# Patient Record
Sex: Female | Born: 1970 | Race: White | Hispanic: No | Marital: Married | Smoking: Current every day smoker
Health system: Southern US, Community
[De-identification: ages and names within clinical notes are randomized; demographics above are authoritative.]

## PROBLEM LIST (undated history)

## (undated) DIAGNOSIS — Q893 Situs inversus: Secondary | ICD-10-CM

## (undated) DIAGNOSIS — R42 Dizziness and giddiness: Secondary | ICD-10-CM

## (undated) DIAGNOSIS — G43909 Migraine, unspecified, not intractable, without status migrainosus: Secondary | ICD-10-CM

## (undated) DIAGNOSIS — J45909 Unspecified asthma, uncomplicated: Secondary | ICD-10-CM

---

## 2001-05-19 ENCOUNTER — Other Ambulatory Visit: Admission: RE | Admit: 2001-05-19 | Discharge: 2001-05-19 | Payer: Self-pay | Admitting: Obstetrics and Gynecology

## 2003-03-24 ENCOUNTER — Ambulatory Visit (HOSPITAL_COMMUNITY): Admission: AD | Admit: 2003-03-24 | Discharge: 2003-03-24 | Payer: Self-pay | Admitting: Obstetrics and Gynecology

## 2003-04-18 ENCOUNTER — Ambulatory Visit (HOSPITAL_COMMUNITY): Admission: AD | Admit: 2003-04-18 | Discharge: 2003-04-18 | Payer: Self-pay | Admitting: Obstetrics and Gynecology

## 2003-04-20 ENCOUNTER — Inpatient Hospital Stay (HOSPITAL_COMMUNITY): Admission: RE | Admit: 2003-04-20 | Discharge: 2003-04-22 | Payer: Self-pay | Admitting: Obstetrics and Gynecology

## 2006-10-27 ENCOUNTER — Ambulatory Visit (HOSPITAL_COMMUNITY): Admission: RE | Admit: 2006-10-27 | Discharge: 2006-10-27 | Payer: Self-pay | Admitting: Family Medicine

## 2006-11-27 ENCOUNTER — Ambulatory Visit (HOSPITAL_COMMUNITY): Payer: Self-pay | Admitting: Psychiatry

## 2007-06-24 ENCOUNTER — Ambulatory Visit (HOSPITAL_COMMUNITY): Admission: RE | Admit: 2007-06-24 | Discharge: 2007-06-24 | Payer: Self-pay | Admitting: Family Medicine

## 2007-12-04 ENCOUNTER — Ambulatory Visit (HOSPITAL_COMMUNITY): Admission: RE | Admit: 2007-12-04 | Discharge: 2007-12-04 | Payer: Self-pay | Admitting: Nurse Practitioner

## 2008-01-07 ENCOUNTER — Ambulatory Visit (HOSPITAL_COMMUNITY): Admission: RE | Admit: 2008-01-07 | Discharge: 2008-01-07 | Payer: Self-pay | Admitting: Nurse Practitioner

## 2010-06-03 ENCOUNTER — Emergency Department (HOSPITAL_COMMUNITY): Admission: EM | Admit: 2010-06-03 | Discharge: 2010-06-03 | Payer: Self-pay | Admitting: Emergency Medicine

## 2010-07-01 ENCOUNTER — Emergency Department (HOSPITAL_COMMUNITY): Admission: EM | Admit: 2010-07-01 | Discharge: 2010-07-01 | Payer: Self-pay | Admitting: Emergency Medicine

## 2011-05-17 NOTE — H&P (Signed)
   NAME:  April Mclaughlin, April Mclaughlin NO.:  [id number]   MEDICAL RECORD NO.:  [id number]                   PATIENT TYPE:  INP   LOCATION:  LDR1                                 FACILITY:  [facility]   PHYSICIAN:  Tilda Burrow, M.D.              DATE OF BIRTH:  1971/06/22   DATE OF ADMISSION:  04/20/2003  DATE OF DISCHARGE:                                HISTORY & PHYSICAL   REASON FOR ADMISSION:  Pregnancy at 40 weeks with onset of labor.  The  patient presented at approximately 5 a.m. in active labor.   PAST MEDICAL HISTORY:  Medical history is positive for depression.   PAST SURGICAL HISTORY:  Surgical history is negative.   ALLERGIES:  She is allergic to PENICILLIN.   MEDICATIONS:  She is on prenatal vitamins.   PRENATAL COURSE:  Prenatal course was essentially uneventful.   PRENATAL LABORATORIES:  Blood type is A positive, antibody screen is  negative, rubella is immune at 116, VDRL is nonreactive, UDS is positive for  THC, hepatitis B surface antigen negative, HIV is negative, Chlamydia and GC  are both negative.  She was prior GBS positive 28 weeks, hemoglobin 7.4,  hematocrit was 37.8, 1-hour glucose was 145, 3-hour glucose was normal.   PHYSICAL EXAMINATION:  Vital signs within normal limits, fetal heart rate is  stable, cervix is approximately 5 cm, and she is having regular  contractions.   PLAN:  We are going to admit and expect vaginal delivery and start  prophylaxis for prior positive GBS.     Zerita Boers, Reita Cliche, M.D.    DL/MEDQ  D:  91/47/8295  T:  04/20/2003  Job:  [id number]   cc:   [facility]

## 2011-05-17 NOTE — Op Note (Signed)
   NAME:  ATIYAH, BAUER NO.:  [id number]   MEDICAL RECORD NO.:  [id number]                   PATIENT TYPE:  INP   LOCATION:  LDR1                                 FACILITY:  [facility]   PHYSICIAN:  Tilda Burrow, M.D.              DATE OF BIRTH:  [birth year]   DATE OF PROCEDURE:  04/20/2003  DATE OF DISCHARGE:                                  PROCEDURE NOTE   DELIVERY SUMMARY:   ONSET OF LABOR:  April 20, 2003 at 1:30 a.m.   DATE OF DELIVERY:  April 20, 2003 at 8:37 a.m.   LENGTH OF FIRST STAGE LABOR:  Seven hours.   LENGTH OF SECOND STAGE LABOR:  Seven minutes.   LENGTH OF THIRD STAGE LABOR:  Three minutes.   DELIVERY NOTE:  The patient had a normal spontaneous vaginal delivery of a  viable female infant weighing 7 pounds 2 ounces.  Upon delivery of infant,  there was thorough suctioning, drying of infant; infant had good tone,  movement of all extremities and a strong cry and pinked up very well.  Cord  was clamped and cut and the infant placed on mother's abdomen in good  condition.  Upon inspection, perineum was noted to be intact.  Third stage  of labor was actively managed with 20 units of Pitocin in 1000 mL of D-5 LR  at a rapid rate.  Placenta delivered spontaneously via Schultze's mechanism,  with membranes intact upon inspection; three-vessel cord was noted upon  inspection.  Estimated blood loss was approximately 300 mL.  Mother and  infant were stabilized, tolerated delivery well and transferred out to the  postpartum unit in stable condition.      Zerita Boers, Reita Cliche, M.D.    DL/MEDQ  D:  62/70/3500  T:  04/20/2003  Job:  [id number]   cc:   [facility]

## 2015-06-16 ENCOUNTER — Emergency Department (HOSPITAL_COMMUNITY): Payer: BLUE CROSS/BLUE SHIELD

## 2015-06-16 ENCOUNTER — Emergency Department (HOSPITAL_COMMUNITY)
Admission: EM | Admit: 2015-06-16 | Discharge: 2015-06-16 | Disposition: A | Discharge status: Home / Self Care | Payer: BLUE CROSS/BLUE SHIELD | Attending: Emergency Medicine | Admitting: Emergency Medicine

## 2015-06-16 ENCOUNTER — Encounter (HOSPITAL_COMMUNITY): Payer: Self-pay | Admitting: Emergency Medicine

## 2015-06-16 DIAGNOSIS — R42 Dizziness and giddiness: Secondary | ICD-10-CM | POA: Diagnosis present

## 2015-06-16 DIAGNOSIS — Z880 Allergy status to penicillin: Secondary | ICD-10-CM | POA: Diagnosis not present

## 2015-06-16 DIAGNOSIS — H81399 Other peripheral vertigo, unspecified ear: Secondary | ICD-10-CM | POA: Insufficient documentation

## 2015-06-16 DIAGNOSIS — J45909 Unspecified asthma, uncomplicated: Secondary | ICD-10-CM | POA: Insufficient documentation

## 2015-06-16 DIAGNOSIS — Z3202 Encounter for pregnancy test, result negative: Secondary | ICD-10-CM | POA: Insufficient documentation

## 2015-06-16 DIAGNOSIS — Q893 Situs inversus: Secondary | ICD-10-CM | POA: Insufficient documentation

## 2015-06-16 DIAGNOSIS — H8110 Benign paroxysmal vertigo, unspecified ear: Secondary | ICD-10-CM

## 2015-06-16 DIAGNOSIS — Z720 Tobacco use: Secondary | ICD-10-CM | POA: Diagnosis not present

## 2015-06-16 DIAGNOSIS — Z8679 Personal history of other diseases of the circulatory system: Secondary | ICD-10-CM | POA: Insufficient documentation

## 2015-06-16 HISTORY — DX: Unspecified asthma, uncomplicated: J45.909

## 2015-06-16 HISTORY — DX: Situs inversus: Q89.3

## 2015-06-16 HISTORY — DX: Migraine, unspecified, not intractable, without status migrainosus: G43.909

## 2015-06-16 LAB — CBC WITH DIFFERENTIAL/PLATELET
BASOS ABS: 0 10*3/uL (ref 0.0–0.1)
Basophils Relative: 0 % (ref 0–1)
Eosinophils Absolute: 0.2 10*3/uL (ref 0.0–0.7)
Eosinophils Relative: 2 % (ref 0–5)
HCT: 46.7 % — ABNORMAL HIGH (ref 36.0–46.0)
Hemoglobin: 15 g/dL (ref 12.0–15.0)
LYMPHS ABS: 2.2 10*3/uL (ref 0.7–4.0)
Lymphocytes Relative: 24 % (ref 12–46)
MCH: 29.7 pg (ref 26.0–34.0)
MCHC: 32.1 g/dL (ref 30.0–36.0)
MCV: 92.5 fL (ref 78.0–100.0)
MONO ABS: 0.5 10*3/uL (ref 0.1–1.0)
MONOS PCT: 5 % (ref 3–12)
NEUTROS ABS: 6.2 10*3/uL (ref 1.7–7.7)
Neutrophils Relative %: 69 % (ref 43–77)
Platelets: 252 10*3/uL (ref 150–400)
RBC: 5.05 MIL/uL (ref 3.87–5.11)
RDW: 14 % (ref 11.5–15.5)
WBC: 9.1 10*3/uL (ref 4.0–10.5)

## 2015-06-16 LAB — BASIC METABOLIC PANEL
Anion gap: 5 (ref 5–15)
BUN: 14 mg/dL (ref 6–20)
CALCIUM: 8.8 mg/dL — AB (ref 8.9–10.3)
CHLORIDE: 108 mmol/L (ref 101–111)
CO2: 26 mmol/L (ref 22–32)
CREATININE: 0.96 mg/dL (ref 0.44–1.00)
Glucose, Bld: 107 mg/dL — ABNORMAL HIGH (ref 65–99)
Potassium: 4.1 mmol/L (ref 3.5–5.1)
Sodium: 139 mmol/L (ref 135–145)

## 2015-06-16 LAB — CBG MONITORING, ED: Glucose-Capillary: 108 mg/dL — ABNORMAL HIGH (ref 65–99)

## 2015-06-16 LAB — POC URINE PREG, ED: Preg Test, Ur: NEGATIVE

## 2015-06-16 MED ORDER — LORAZEPAM 1 MG PO TABS: 1.0000 mg | ORAL_TABLET | Freq: Three times a day (TID) | ORAL | Status: DC | PRN

## 2015-06-16 MED ORDER — LORAZEPAM 1 MG PO TABS
1.0000 mg | ORAL_TABLET | Freq: Once | ORAL | Status: AC
  Administered 2015-06-16: 1 mg via ORAL
  Filled 2015-06-16: qty 1

## 2015-06-16 MED ORDER — MECLIZINE HCL 25 MG PO TABS: 25.0000 mg | ORAL_TABLET | Freq: Three times a day (TID) | ORAL | Status: DC | PRN

## 2015-06-16 MED ORDER — MECLIZINE HCL 12.5 MG PO TABS
25.0000 mg | ORAL_TABLET | Freq: Once | ORAL | Status: AC
  Administered 2015-06-16: 25 mg via ORAL
  Filled 2015-06-16: qty 2

## 2015-06-16 NOTE — Discharge Instructions (Signed)
Benign Positional Vertigo Vertigo means you feel like you or your surroundings are moving when they are not. Benign positional vertigo is the most common form of vertigo. Benign means that the cause of your condition is not serious. Benign positional vertigo is more common in older adults. CAUSES  Benign positional vertigo is the result of an upset in the labyrinth system. This is an area in the middle ear that helps control your balance. This may be caused by a viral infection, head injury, or repetitive motion. However, often no specific cause is found. SYMPTOMS  Symptoms of benign positional vertigo occur when you move your head or eyes in different directions. Some of the symptoms may include:  Loss of balance and falls.  Vomiting.  Blurred vision.  Dizziness.  Nausea.  Involuntary eye movements (nystagmus). DIAGNOSIS  Benign positional vertigo is usually diagnosed by physical exam. If the specific cause of your benign positional vertigo is unknown, your caregiver may perform imaging tests, such as magnetic resonance imaging (MRI) or computed tomography (CT). TREATMENT  Your caregiver may recommend movements or procedures to correct the benign positional vertigo. Medicines such as meclizine, benzodiazepines, and medicines for nausea may be used to treat your symptoms. In rare cases, if your symptoms are caused by certain conditions that affect the inner ear, you may need surgery. HOME CARE INSTRUCTIONS   Follow your caregiver's instructions.  Move slowly. Do not make sudden body or head movements.  Avoid driving.  Avoid operating heavy machinery.  Avoid performing any tasks that would be dangerous to you or others during a vertigo episode.  Drink enough fluids to keep your urine clear or pale yellow. SEEK IMMEDIATE MEDICAL CARE IF:   You develop problems with walking, weakness, numbness, or using your arms, hands, or legs.  You have difficulty speaking.  You develop  severe headaches.  Your nausea or vomiting continues or gets worse.  You develop visual changes.  Your family or friends notice any behavioral changes.  Your condition gets worse.  You have a fever.  You develop a stiff neck or sensitivity to light. MAKE SURE YOU:   Understand these instructions.  Will watch your condition.  Will get help right away if you are not doing well or get worse. Document Released: 09/23/2006 Document Revised: 03/09/2012 Document Reviewed: 09/05/2011 [employer] Patient Information 2015 [employer]. This information is not intended to replace advice given to you by your health care provider. Make sure you discuss any questions you have with your health care provider.    

## 2015-06-16 NOTE — ED Notes (Signed)
Patient reports dizziness that started yesterday morning. Per patient feels like room is spinning intermittently but has a "wobbly" feeling constantly. Denies any headache, slurred speech, facial drooping or weakness. Patient does report occasional difficulty pronouncing words. [facility]

## 2015-06-18 NOTE — ED Provider Notes (Signed)
CSN: [id number]     Arrival date & time 06/16/15  1716 History   First MD Initiated Contact with Patient 06/16/15 1755     Chief Complaint  Patient presents with  . Dizziness     (Consider location/radiation/quality/duration/timing/severity/associated sxs/prior Treatment) Patient is a 44 y.o. female presenting with dizziness. The history is provided by the patient and the spouse.  Dizziness Quality:  Room spinning Severity:  Moderate Onset quality:  Unable to specify (woke with yesterday morning) Timing:  Intermittent (triggered by head movement and positional changes) Progression:  Unchanged Chronicity:  New Context: not with ear pain and not with loss of consciousness   Relieved by:  Being still Worsened by:  Movement Ineffective treatments:  None tried Associated symptoms: no chest pain, no headaches, no hearing loss, no nausea, no palpitations, no shortness of breath, no tinnitus, no vision changes and no weakness   Associated symptoms comment:  Denies focal weakness, no nausea, no fevers, no recent illnesses.  She has noticed several episodes of fleeting difficulty enunciating longer words today.  She states this is not unusual when anxious.  Risk factors: no anemia, no heart disease, no hx of vertigo, no multiple medications and no new medications     Past Medical History  Diagnosis Date  . Situs inversus   . Asthma   . Migraines    History reviewed. No pertinent past surgical history. Family History  Problem Relation Age of Onset  . Non-Hodgkin's lymphoma Father   . Emphysema Mother   . COPD Father   . COPD Mother    History  Substance Use Topics  . Smoking status: Current Every Day Smoker -- 1.50 packs/day for 20 years    Types: Cigarettes  . Smokeless tobacco: Never Used  . Alcohol Use: No   OB History    Gravida Para Term Preterm AB TAB SAB Ectopic Multiple Living   3 3 3       3      Review of Systems  Constitutional: Negative for fever.  HENT:  Negative for congestion, hearing loss, sore throat and tinnitus.   Eyes: Negative.   Respiratory: Negative for chest tightness and shortness of breath.   Cardiovascular: Negative for chest pain and palpitations.  Gastrointestinal: Negative for nausea and abdominal pain.  Genitourinary: Negative.   Musculoskeletal: Negative for joint swelling, arthralgias and neck pain.  Skin: Negative.  Negative for rash and wound.  Neurological: Positive for dizziness. Negative for facial asymmetry, weakness, light-headedness, numbness and headaches.  Psychiatric/Behavioral: Negative.       Allergies  Bee venom; Penicillins; Shellfish allergy; Eggs or egg-derived products; Meloxicam; and Tree extract  Home Medications   Prior to Admission medications   Medication Sig Start Date End Date Taking? Authorizing Provider  LORazepam (ATIVAN) 1 MG tablet Take 1 tablet (1 mg total) by mouth 3 (three) times daily as needed (dizziness). 06/16/15   Burgess Amor, PA-C  meclizine (ANTIVERT) 25 MG tablet Take 1 tablet (25 mg total) by mouth 3 (three) times daily as needed for dizziness. 06/16/15   Burgess Amor, PA-C   BP 152/87 mmHg  Pulse 78  Temp(Src) 98.7 F (37.1 C) (Oral)  Resp 18  Ht 5\' 2"  (1.575 m)  Wt 230 lb (104.327 kg)  BMI 42.06 kg/m2  SpO2 96%  LMP 06/16/2015 Physical Exam  Constitutional: She is oriented to person, place, and time. She appears well-developed and well-nourished.  HENT:  Head: Normocephalic and atraumatic.  Right Ear: External ear normal.  Left  Ear: External ear normal.  Eyes: Conjunctivae are normal.  Neck: Normal range of motion.  Cardiovascular: Normal rate, regular rhythm, normal heart sounds and intact distal pulses.   Pulmonary/Chest: Effort normal and breath sounds normal. She has no wheezes.  Musculoskeletal: Normal range of motion.  Neurological: She is alert and oriented to person, place, and time. She has normal strength. No cranial nerve deficit or sensory deficit.  Coordination and gait normal. GCS eye subscore is 4. GCS verbal subscore is 5. GCS motor subscore is 6.  Reflex Scores:      Bicep reflexes are 2+ on the right side and 2+ on the left side.      Patellar reflexes are 2+ on the right side and 2+ on the left side. Normal rapid alternating movements, no pronator drift, normal heel shin, down going toes, equal grip strength.   Pt had one episode of stumbling over a single word during the initial interview, repeated the word without difficulty.  Normal speech pattern.  Skin: Skin is warm and dry.  Psychiatric: She has a normal mood and affect.  Nursing note and vitals reviewed.   ED Course  Procedures (including critical care time) Labs Review Labs Reviewed  CBC WITH DIFFERENTIAL/PLATELET - Abnormal; Notable for the following:    HCT 46.7 (*)    All other components within normal limits  BASIC METABOLIC PANEL - Abnormal; Notable for the following:    Glucose, Bld 107 (*)    Calcium 8.8 (*)    All other components within normal limits  CBG MONITORING, ED - Abnormal; Notable for the following:    Glucose-Capillary 108 (*)    All other components within normal limits  POC URINE PREG, ED    Imaging Review Ct Head Wo Contrast  06/16/2015   CLINICAL DATA:  Dizziness  EXAM: CT HEAD WITHOUT CONTRAST  TECHNIQUE: Contiguous axial images were obtained from the base of the skull through the vertex without intravenous contrast.  COMPARISON:  None.  FINDINGS: Mild motion artifact obscures detail. No acute hemorrhage, infarct, or mass lesion is identified. No skull fracture. Orbits and paranasal sinuses are unremarkable.  IMPRESSION: Normal head CT.   Electronically Signed   By: Christiana Pellant M.D.   On: 06/16/2015 19:11     EKG Interpretation   Date/Time:  Friday June 16 2015 17:38:44 EDT Ventricular Rate:  71 PR Interval:  150 QRS Duration: 82 QT Interval:  390 QTC Calculation: 423 R Axis:   65 Text Interpretation:  Normal sinus rhythm  Cannot rule out Anterior infarct  , age undetermined Abnormal ECG ED PHYSICIAN INTERPRETATION AVAILABLE IN  [facility] Confirmed by TEST, Record ([id number]) on 06/17/2015 10:03:21 AM      MDM   Final diagnoses:  Vertigo, benign positional, unspecified laterality    Exam and history most c/w peripheral vertigo.  She was given meclizine and ativan here with near resolution of sx.  She was prescribed these medicines for home use. Advised she needs to return here for any worsened sx, otw f/u if sx persist beyond the next week.  Pt with no current pcp, she was referred to Dr. Gerilyn Pilgrim for outpt f/u if needed. Pt and husband understand and agree with plan. Stressed worsening sx should have prompt recheck.  The patient appears reasonably screened and/or stabilized for discharge and I doubt any other medical condition or other [facility] requiring further screening, evaluation, or treatment in the ED at this time prior to discharge.  Pt was discussed with  Dr. Rubin Payor prior to dc home.    Burgess Amor, PA-C 06/18/15 1327  Benjiman Core, MD 06/19/15 [phone]

## 2017-01-27 ENCOUNTER — Emergency Department (HOSPITAL_COMMUNITY)
Admission: EM | Admit: 2017-01-27 | Discharge: 2017-01-27 | Disposition: A | Discharge status: Home / Self Care | Payer: BLUE CROSS/BLUE SHIELD | Attending: Emergency Medicine | Admitting: Emergency Medicine

## 2017-01-27 ENCOUNTER — Encounter (HOSPITAL_COMMUNITY): Payer: Self-pay | Admitting: *Deleted

## 2017-01-27 ENCOUNTER — Emergency Department (HOSPITAL_COMMUNITY): Payer: BLUE CROSS/BLUE SHIELD

## 2017-01-27 DIAGNOSIS — R51 Headache: Secondary | ICD-10-CM | POA: Diagnosis not present

## 2017-01-27 DIAGNOSIS — R5383 Other fatigue: Secondary | ICD-10-CM | POA: Diagnosis not present

## 2017-01-27 DIAGNOSIS — R42 Dizziness and giddiness: Secondary | ICD-10-CM | POA: Insufficient documentation

## 2017-01-27 DIAGNOSIS — F17210 Nicotine dependence, cigarettes, uncomplicated: Secondary | ICD-10-CM | POA: Insufficient documentation

## 2017-01-27 DIAGNOSIS — R531 Weakness: Secondary | ICD-10-CM | POA: Insufficient documentation

## 2017-01-27 DIAGNOSIS — R110 Nausea: Secondary | ICD-10-CM | POA: Diagnosis not present

## 2017-01-27 DIAGNOSIS — J45909 Unspecified asthma, uncomplicated: Secondary | ICD-10-CM | POA: Diagnosis not present

## 2017-01-27 DIAGNOSIS — R519 Headache, unspecified: Secondary | ICD-10-CM

## 2017-01-27 HISTORY — DX: Dizziness and giddiness: R42

## 2017-01-27 LAB — BASIC METABOLIC PANEL
Anion gap: 8 (ref 5–15)
BUN: 9 mg/dL (ref 6–20)
CHLORIDE: 107 mmol/L (ref 101–111)
CO2: 23 mmol/L (ref 22–32)
CREATININE: 0.86 mg/dL (ref 0.44–1.00)
Calcium: 9 mg/dL (ref 8.9–10.3)
Glucose, Bld: 96 mg/dL (ref 65–99)
POTASSIUM: 3.5 mmol/L (ref 3.5–5.1)
SODIUM: 138 mmol/L (ref 135–145)

## 2017-01-27 LAB — CBC WITH DIFFERENTIAL/PLATELET
BASOS ABS: 0 10*3/uL (ref 0.0–0.1)
BASOS PCT: 0 %
EOS ABS: 0.1 10*3/uL (ref 0.0–0.7)
Eosinophils Relative: 1 %
HCT: 43.4 % (ref 36.0–46.0)
HEMOGLOBIN: 14.1 g/dL (ref 12.0–15.0)
Lymphocytes Relative: 25 %
Lymphs Abs: 2.2 10*3/uL (ref 0.7–4.0)
MCH: 29 pg (ref 26.0–34.0)
MCHC: 32.5 g/dL (ref 30.0–36.0)
MCV: 89.1 fL (ref 78.0–100.0)
Monocytes Absolute: 0.5 10*3/uL (ref 0.1–1.0)
Monocytes Relative: 6 %
NEUTROS PCT: 68 %
Neutro Abs: 5.9 10*3/uL (ref 1.7–7.7)
PLATELETS: 271 10*3/uL (ref 150–400)
RBC: 4.87 MIL/uL (ref 3.87–5.11)
RDW: 14.2 % (ref 11.5–15.5)
WBC: 8.7 10*3/uL (ref 4.0–10.5)

## 2017-01-27 LAB — URINALYSIS, ROUTINE W REFLEX MICROSCOPIC
BACTERIA UA: NONE SEEN
Bilirubin Urine: NEGATIVE
Glucose, UA: NEGATIVE mg/dL
KETONES UR: NEGATIVE mg/dL
LEUKOCYTES UA: NEGATIVE
NITRITE: NEGATIVE
PH: 6 (ref 5.0–8.0)
PROTEIN: NEGATIVE mg/dL
Specific Gravity, Urine: 1.002 — ABNORMAL LOW (ref 1.005–1.030)

## 2017-01-27 LAB — TROPONIN I: Troponin I: <0.03 ng/mL (ref <0.03)

## 2017-01-27 LAB — PREGNANCY, URINE: PREG TEST UR: NEGATIVE

## 2017-01-27 LAB — CBG MONITORING, ED: Glucose-Capillary: 134 mg/dL — ABNORMAL HIGH (ref 65–99)

## 2017-01-27 MED ORDER — DEXAMETHASONE SODIUM PHOSPHATE 4 MG/ML IJ SOLN
10.0000 mg | Freq: Once | INTRAMUSCULAR | Status: AC
  Administered 2017-01-27: 10 mg via INTRAVENOUS
  Filled 2017-01-27: qty 3

## 2017-01-27 MED ORDER — IOPAMIDOL (ISOVUE-370) INJECTION 76%
100.0000 mL | Freq: Once | INTRAVENOUS | Status: AC | PRN
  Administered 2017-01-27: 100 mL via INTRAVENOUS

## 2017-01-27 MED ORDER — MECLIZINE HCL 12.5 MG PO TABS: 12.5000 mg | ORAL_TABLET | Freq: Three times a day (TID) | ORAL | 0 refills | Status: DC | PRN

## 2017-01-27 MED ORDER — SODIUM CHLORIDE 0.9 % IV BOLUS (SEPSIS)
1000.0000 mL | Freq: Once | INTRAVENOUS | Status: AC
  Administered 2017-01-27: 1000 mL via INTRAVENOUS

## 2017-01-27 MED ORDER — DIPHENHYDRAMINE HCL 50 MG/ML IJ SOLN
25.0000 mg | Freq: Once | INTRAMUSCULAR | Status: AC
  Administered 2017-01-27: 25 mg via INTRAVENOUS
  Filled 2017-01-27: qty 1

## 2017-01-27 MED ORDER — NAPROXEN 500 MG PO TABS: 500.0000 mg | ORAL_TABLET | Freq: Two times a day (BID) | ORAL | 0 refills | Status: DC

## 2017-01-27 MED ORDER — METOCLOPRAMIDE HCL 5 MG/ML IJ SOLN
10.0000 mg | Freq: Once | INTRAMUSCULAR | Status: AC
  Administered 2017-01-27: 10 mg via INTRAVENOUS
  Filled 2017-01-27: qty 2

## 2017-01-27 MED ORDER — FENTANYL CITRATE (PF) 100 MCG/2ML IJ SOLN
50.0000 ug | Freq: Once | INTRAMUSCULAR | Status: AC
  Administered 2017-01-27: 50 ug via INTRAVENOUS
  Filled 2017-01-27: qty 2

## 2017-01-27 NOTE — Discharge Instructions (Signed)
Follow-up with your doctor.  Return to the ED if you develop new or worsening symptoms. °

## 2017-01-27 NOTE — ED Provider Notes (Signed)
[facility]Y DEPT Provider Note   CSN: [id number] Arrival date & time: 01/27/17  0102     History   Chief Complaint Chief Complaint  Patient presents with  . Headache    HPI April Mclaughlin is a 46 y.o. female.  Patient with multiple complaints. She reports a frontal headache onset 2 days ago associated with photosensitivity and nausea. This is also associated with dizziness when she stands and feels like a "drunk feeling" but not a vertigo feeling. She has a history of migraines as well as vertigo. She states this headache is dissimilar to her migraine and is not as severe. She standing at work when the headache happened denies the worst headache of her life and denies thunderclap onset. She says she had a sit down she felt dizzy and nauseated and the headache started after that. She went home and went to bed. The headache has persisted since but is improved. She complains of pain to her right arm from the shoulder down to her elbow. This is been hurting her for the past one week and denies any trauma or injury. Denies any weakness, numbness or tingling. Does have trouble lifting with his arm at work which she normally does not. She did not take any medicine at home for the headache. Denies any blurry vision or double vision. Denies any visual loss. Denies any chest pain or shortness of breath.   The history is provided by the patient.  Headache   Associated symptoms include nausea. Pertinent negatives include no fever, no shortness of breath and no vomiting.    Past Medical History:  Diagnosis Date  . Asthma   . Migraines   . Situs inversus   . Vertigo     There are no active problems to display for this patient.   History reviewed. No pertinent surgical history.  OB History    Gravida Para Term Preterm AB Living   3 3 3     3    SAB TAB Ectopic Multiple Live Births                   Home Medications    Prior to Admission medications   Medication Sig Start  Date End Date Taking? Authorizing Provider  LORazepam (ATIVAN) 1 MG tablet Take 1 tablet (1 mg total) by mouth 3 (three) times daily as needed (dizziness). 06/16/15   Burgess Amor, PA-C  meclizine (ANTIVERT) 25 MG tablet Take 1 tablet (25 mg total) by mouth 3 (three) times daily as needed for dizziness. 06/16/15   Burgess Amor, PA-C    Family History Family History  Problem Relation Age of Onset  . Non-Hodgkin's lymphoma Father   . COPD Father   . Emphysema Mother   . COPD Mother     Social History Social History  Substance Use Topics  . Smoking status: Current Every Day Smoker    Packs/day: 1.00    Years: 20.00    Types: Cigarettes  . Smokeless tobacco: Never Used  . Alcohol use No     Allergies   Bee venom; Penicillins; Shellfish allergy; Eggs or egg-derived products; Meloxicam; and Tree extract   Review of Systems Review of Systems  Constitutional: Positive for fatigue. Negative for activity change, appetite change and fever.  HENT: Negative for congestion and rhinorrhea.   Respiratory: Negative for cough, chest tightness and shortness of breath.   Cardiovascular: Negative for chest pain and leg swelling.  Gastrointestinal: Positive for nausea. Negative for abdominal pain and  vomiting.  Genitourinary: Negative for dysuria and hematuria.  Musculoskeletal: Negative for arthralgias, back pain and neck pain.  Skin: Negative for rash.  Neurological: Positive for dizziness, weakness, light-headedness and headaches. Negative for seizures and numbness.  A complete 10 system review of systems was obtained and all systems are negative except as noted in the HPI and PMH.     Physical Exam Updated Vital Signs BP 148/75 (BP Location: Left Arm)   Pulse 65   Temp 97.6 F (36.4 C) (Oral)   Resp 20   Ht 5\' 3"  (1.6 m)   Wt 240 lb (108.9 kg)   LMP 01/15/2017   SpO2 94%   BMI 42.51 kg/m   Physical Exam  Constitutional: She is oriented to person, place, and time. She appears  well-developed and well-nourished. No distress.  HENT:  Head: Normocephalic and atraumatic.  Mouth/Throat: Oropharynx is clear and moist. No oropharyngeal exudate.  Eyes: Conjunctivae and EOM are normal. Pupils are equal, round, and reactive to light.  Neck: Normal range of motion. Neck supple.  No meningismus.  Cardiovascular: Normal rate, regular rhythm, normal heart sounds and intact distal pulses.   No murmur heard. Pulmonary/Chest: Effort normal and breath sounds normal. No respiratory distress. She exhibits no tenderness.  Abdominal: Soft. There is no tenderness. There is no rebound and no guarding.  Musculoskeletal: Normal range of motion. She exhibits tenderness. She exhibits no edema.  TTP R trapezius and rhomboid  Neurological: She is alert and oriented to person, place, and time. No cranial nerve deficit. She exhibits normal muscle tone. Coordination normal.  No ataxia on finger to nose bilaterally. No pronator drift. 5/5 strength throughout. CN 2-12 intact.Equal grip strength. Sensation intact.  Visual fields full to confrontation. No nystagmus, negative romberg, normal gait.  Skin: Skin is warm.  Psychiatric: She has a normal mood and affect. Her behavior is normal.  Nursing note and vitals reviewed.    ED Treatments / Results  Labs (all labs ordered are listed, but only abnormal results are displayed) Labs Reviewed  CBG MONITORING, ED - Abnormal; Notable for the following:       Result Value   Glucose-Capillary 134 (*)    All other components within normal limits  URINALYSIS, ROUTINE W REFLEX MICROSCOPIC  PREGNANCY, URINE  CBC WITH DIFFERENTIAL/PLATELET  BASIC METABOLIC PANEL  TROPONIN I    EKG  EKG Interpretation  Date/Time:  Monday January 27 2017 02:00:02 EST Ventricular Rate:  58 PR Interval:    QRS Duration: 90 QT Interval:  452 QTC Calculation: 444 R Axis:   92 Text Interpretation:  Sinus rhythm Borderline right axis deviation Nonspecific T abnrm,  anterolateral leads Nonspecific T wave abnormality Confirmed by Manus Gunning  MD, Darthula Desa [phone]) on 01/27/2017 2:15:30 AM       Radiology Ct Angio Head W Or Wo Contrast  Result Date: 01/27/2017 CLINICAL DATA:  Initial evaluation for acute headache, dizziness. EXAM: CT ANGIOGRAPHY HEAD AND NECK TECHNIQUE: Multidetector CT imaging of the head and neck was performed using the standard protocol during bolus administration of intravenous contrast. Multiplanar CT image reconstructions and MIPs were obtained to evaluate the vascular anatomy. Carotid stenosis measurements (when applicable) are obtained utilizing NASCET criteria, using the distal internal carotid diameter as the denominator. CONTRAST:  75 cc of Isovue 370. COMPARISON:  Prior CT from 06/16/2015. FINDINGS: CT HEAD FINDINGS Brain: Cerebral volume within normal limits for patient age. No evidence for acute intracranial hemorrhage. No findings to suggest acute large vessel territory infarct. No  mass lesion, midline shift, or mass effect. Ventricles are normal in size without evidence for hydrocephalus. No extra-axial fluid collection identified. Vascular: No hyperdense vessel identified. Skull: Scalp soft tissues demonstrate no acute abnormality.Calvarium intact. Sinuses/Orbits: Globes and orbital soft tissues are within normal limits. Visualized paranasal sinuses are clear. No mastoid effusion. CTA NECK FINDINGS Aortic arch: Left-sided aortic arch. Both of the common carotid arteries arise from the arch itself. No high-grade stenosis at the origin of the great vessels. Visualized subclavian arteries are widely patent. Right carotid system: Right common and internal carotid arteries widely patent without stenosis, dissection, or occlusion. No abnormality seen within the right carotid artery system. Left carotid system: Left common and internal carotid arteries are widely patent without stenosis, dissection, or occlusion. No abnormality seen within the left  carotid artery system. Vertebral arteries: Both of the vertebral arteries arise from the subclavian arteries. Vertebral arteries widely patent without stenosis, dissection, or occlusion. Skeleton: No acute osseous abnormality. No worrisome lytic or blastic osseous lesions. Partial ankylosis of the C4 and C5 vertebral bodies noted. Other neck: No soft tissue abnormality within the neck. No adenopathy. Subcentimeter hypodense left thyroid nodule noted, of doubtful clinical significance. Upper chest: Visualized upper mediastinum within normal limits. Visualized lungs are clear. Review of the MIP images confirms the above findings CTA HEAD FINDINGS Anterior circulation: Petrous, cavernous, and supraclinoid segments of the internal carotid arteries widely patent bilaterally. A1 segments, anterior communicating artery common anterior cerebral arteries widely patent. M1 segments widely patent without stenosis or occlusion. MCA bifurcations normal. Distal MCA branches well opacified and symmetric. Posterior circulation: Vertebral arteries widely patent to the vertebrobasilar junction. Posterior inferior cerebral arteries patent bilaterally. Basilar artery widely patent. Superior cerebral arteries patent bilaterally. Both of the posterior cerebral arteries largely supplied via the basilar artery and are patent to their distal aspects. Small right posterior communicating artery noted. Venous sinuses: Pain. Anatomic variants: No significant anatomic variant. No aneurysm or vascular malformation. Delayed phase: No pathologic enhancement. Review of the MIP images confirms the above findings IMPRESSION: 1. Normal CTA of the head and neck. 2. Left-sided aortic arch. 3. Mild cerebral white matter disease, nonspecific, but stable from prior. No acute intracranial process identified. Electronically Signed   By: Rise Mu M.D.   On: 01/27/2017 05:11   Ct Angio Neck W And/or Wo Contrast  Result Date: 01/27/2017 CLINICAL  DATA:  Initial evaluation for acute headache, dizziness. EXAM: CT ANGIOGRAPHY HEAD AND NECK TECHNIQUE: Multidetector CT imaging of the head and neck was performed using the standard protocol during bolus administration of intravenous contrast. Multiplanar CT image reconstructions and MIPs were obtained to evaluate the vascular anatomy. Carotid stenosis measurements (when applicable) are obtained utilizing NASCET criteria, using the distal internal carotid diameter as the denominator. CONTRAST:  75 cc of Isovue 370. COMPARISON:  Prior CT from 06/16/2015. FINDINGS: CT HEAD FINDINGS Brain: Cerebral volume within normal limits for patient age. No evidence for acute intracranial hemorrhage. No findings to suggest acute large vessel territory infarct. No mass lesion, midline shift, or mass effect. Ventricles are normal in size without evidence for hydrocephalus. No extra-axial fluid collection identified. Vascular: No hyperdense vessel identified. Skull: Scalp soft tissues demonstrate no acute abnormality.Calvarium intact. Sinuses/Orbits: Globes and orbital soft tissues are within normal limits. Visualized paranasal sinuses are clear. No mastoid effusion. CTA NECK FINDINGS Aortic arch: Left-sided aortic arch. Both of the common carotid arteries arise from the arch itself. No high-grade stenosis at the origin of the great vessels. Visualized  subclavian arteries are widely patent. Right carotid system: Right common and internal carotid arteries widely patent without stenosis, dissection, or occlusion. No abnormality seen within the right carotid artery system. Left carotid system: Left common and internal carotid arteries are widely patent without stenosis, dissection, or occlusion. No abnormality seen within the left carotid artery system. Vertebral arteries: Both of the vertebral arteries arise from the subclavian arteries. Vertebral arteries widely patent without stenosis, dissection, or occlusion. Skeleton: No acute  osseous abnormality. No worrisome lytic or blastic osseous lesions. Partial ankylosis of the C4 and C5 vertebral bodies noted. Other neck: No soft tissue abnormality within the neck. No adenopathy. Subcentimeter hypodense left thyroid nodule noted, of doubtful clinical significance. Upper chest: Visualized upper mediastinum within normal limits. Visualized lungs are clear. Review of the MIP images confirms the above findings CTA HEAD FINDINGS Anterior circulation: Petrous, cavernous, and supraclinoid segments of the internal carotid arteries widely patent bilaterally. A1 segments, anterior communicating artery common anterior cerebral arteries widely patent. M1 segments widely patent without stenosis or occlusion. MCA bifurcations normal. Distal MCA branches well opacified and symmetric. Posterior circulation: Vertebral arteries widely patent to the vertebrobasilar junction. Posterior inferior cerebral arteries patent bilaterally. Basilar artery widely patent. Superior cerebral arteries patent bilaterally. Both of the posterior cerebral arteries largely supplied via the basilar artery and are patent to their distal aspects. Small right posterior communicating artery noted. Venous sinuses: Pain. Anatomic variants: No significant anatomic variant. No aneurysm or vascular malformation. Delayed phase: No pathologic enhancement. Review of the MIP images confirms the above findings IMPRESSION: 1. Normal CTA of the head and neck. 2. Left-sided aortic arch. 3. Mild cerebral white matter disease, nonspecific, but stable from prior. No acute intracranial process identified. Electronically Signed   By: Rise MuBenjamin  McClintock M.D.   On: 01/27/2017 05:11    Procedures Procedures (including critical care time)  Medications Ordered in ED Medications  sodium chloride 0.9 % bolus 1,000 mL (not administered)     Initial Impression / Assessment and Plan / ED Course  I have reviewed the triage vital signs and the nursing  notes.  Pertinent labs & imaging results that were available during my care of the patient were reviewed by me and considered in my medical decision making (see chart for details).     Patient resents with 2 days of headache, nausea as well as pain in her right arm. There is no weakness or sensory deficit on exam. Intact distal pulses.  EKG and labs reassuring. Onset headache was 2 days ago, gradual.  Doubt SAH, doubt meningitis.  Doubt TIA, CVA.  CTA negative. No ICH. No aneurysm.   Headache has improved with treatment in the ED. Orthostatics negative.  Patient is tolerating PO and ambulatory.  No vomiting in the ED.  Troponin negative x2. EKG unchanged.  Headache has resolved.  Will treat for musculoskeletal pain, possible cervical radiculopathy.  Patient states she can tolerate and requests naproxen despite meloxicam allergy. Follow up with PCP. Retun precautions discussed.   Final Clinical Impressions(s) / ED Diagnoses   Final diagnoses:  None    New Prescriptions New Prescriptions   No medications on file     Glynn OctaveStephen Hollis Tuller, MD 01/27/17 [phone]

## 2017-01-27 NOTE — ED Triage Notes (Addendum)
Pt reports swelling to her right neck/shoulder area that is radiating down her right arm. Pt also reports photo sensitivity, nausea, heart burn, and headache and dizziness since Friday.

## 2017-01-27 NOTE — ED Notes (Signed)
Pt ambulated to bathroom with a steady gait

## 2017-01-27 NOTE — ED Notes (Signed)
Pt given water to drink per fluid challenge 

## 2018-01-06 IMAGING — CT CT ANGIO HEAD
1 of 11 series · 5 of 33 positions shown · IV contrast (Isovue)
Comparison: Prior CT from 06/16/2015.

CLINICAL DATA: Initial evaluation for acute headache, dizziness.

EXAM:
CT ANGIOGRAPHY HEAD AND NECK
TECHNIQUE: Multidetector CT imaging of the head and neck was performed using
the standard protocol during bolus administration of intravenous
contrast. Multiplanar CT image reconstructions and MIPs were
obtained to evaluate the vascular anatomy. Carotid stenosis
measurements (when applicable) are obtained utilizing NASCET
criteria, using the distal internal carotid diameter as the
denominator.
CONTRAST:  75 cc of Isovue 370.

[Series 13: ax thin · axial · 0.39mm/px · z∈[+1304,+1523]mm · 5 of 329 slices shown]
[im 55/329  soft-tissue]
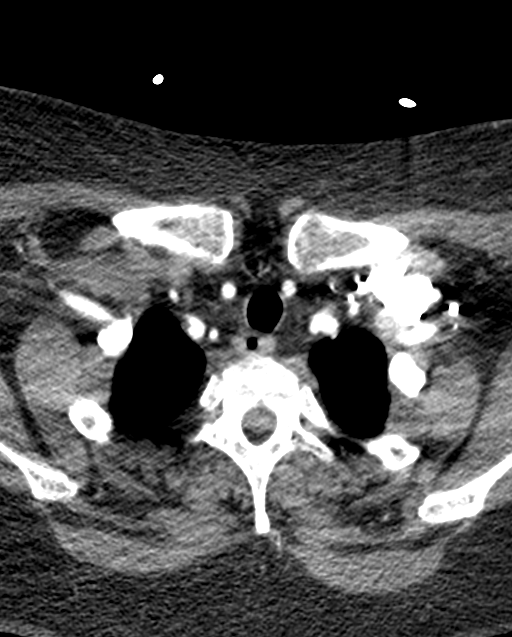
[im 110/329  bone]
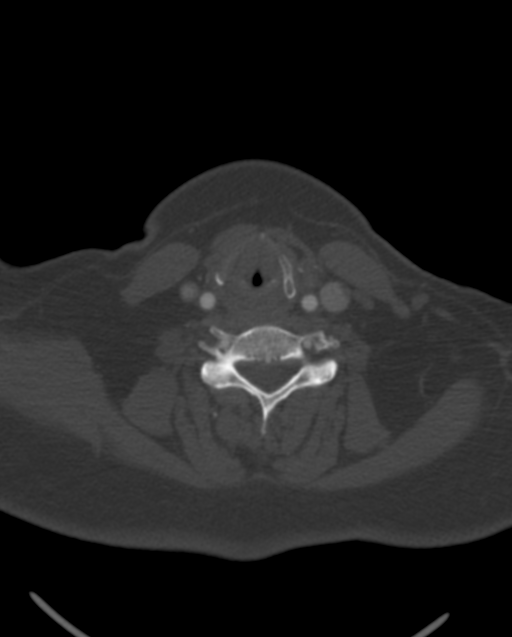
[im 165/329  soft-tissue]
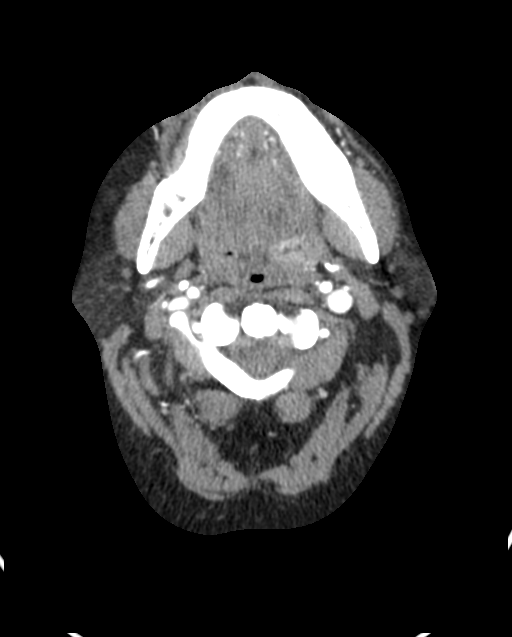
[im 219/329  bone]
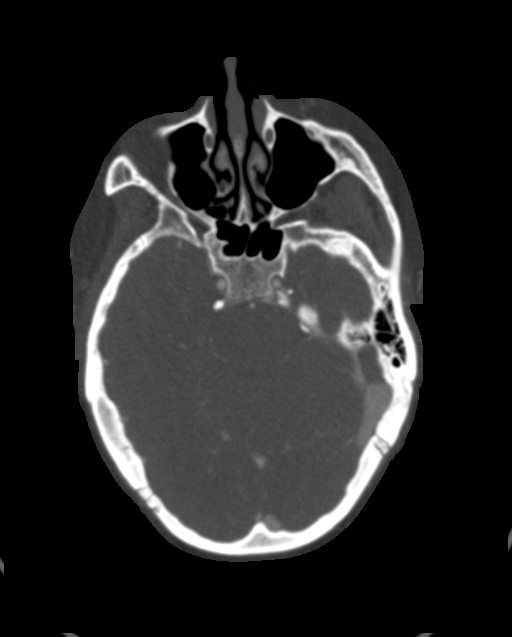
[im 274/329  soft-tissue]
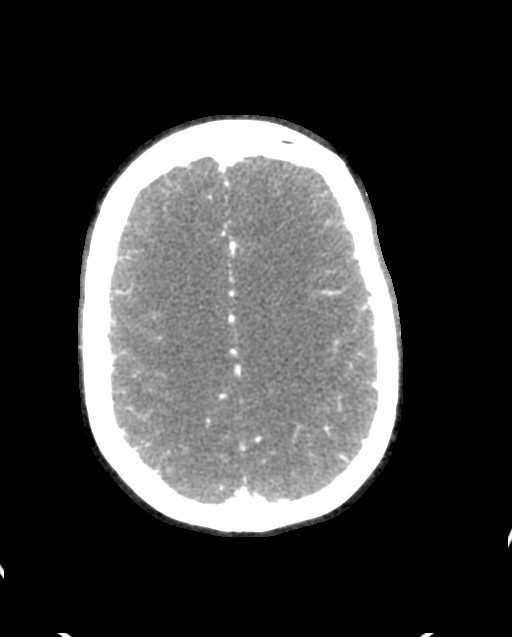

[5 of 33 positions shown; findings below may reference images not displayed]

FINDINGS: CT HEAD FINDINGS

Brain: Cerebral volume within normal limits for patient age.

No evidence for acute intracranial hemorrhage. No findings to
suggest acute large vessel territory infarct. No mass lesion,
midline shift, or mass effect. Ventricles are normal in size without
evidence for hydrocephalus. No extra-axial fluid collection
identified.

Vascular: No hyperdense vessel identified.

Skull: Scalp soft tissues demonstrate no acute abnormality.Calvarium
intact.

Sinuses/Orbits: Globes and orbital soft tissues are within normal
limits.

Visualized paranasal sinuses are clear. No mastoid effusion.

CTA NECK FINDINGS

Aortic arch: Left-sided aortic arch. Both of the common carotid
arteries arise from the arch itself. No high-grade stenosis at the
origin of the great vessels. Visualized subclavian arteries are
widely patent.

Right carotid system: Right common and internal carotid arteries
widely patent without stenosis, dissection, or occlusion. No
abnormality seen within the right carotid artery system.

Left carotid system: Left common and internal carotid arteries are
widely patent without stenosis, dissection, or occlusion. No
abnormality seen within the left carotid artery system.

Vertebral arteries: Both of the vertebral arteries arise from the
subclavian arteries. Vertebral arteries widely patent without
stenosis, dissection, or occlusion.

Skeleton: No acute osseous abnormality. No worrisome lytic or
blastic osseous lesions. Partial ankylosis of the C4 and C5
vertebral bodies noted.

Other neck: No soft tissue abnormality within the neck. No
adenopathy. Subcentimeter hypodense left thyroid nodule noted, of
doubtful clinical significance.

Upper chest: Visualized upper mediastinum within normal limits.
Visualized lungs are clear.

Review of the MIP images confirms the above findings

CTA HEAD FINDINGS

Anterior circulation: Petrous, cavernous, and supraclinoid segments
of the internal carotid arteries widely patent bilaterally. A1
segments, anterior communicating artery common anterior cerebral
arteries widely patent. M1 segments widely patent without stenosis
or occlusion. MCA bifurcations normal. Distal MCA branches well
opacified and symmetric.

Posterior circulation: Vertebral arteries widely patent to the
vertebrobasilar junction. Posterior inferior cerebral arteries
patent bilaterally. Basilar artery widely patent. Superior cerebral
arteries patent bilaterally. Both of the posterior cerebral arteries
largely supplied via the basilar artery and are patent to their
distal aspects. Small right posterior communicating artery noted.

Venous sinuses: Pain.

Anatomic variants: No significant anatomic variant. No aneurysm or
vascular malformation.

Delayed phase: No pathologic enhancement.

Review of the MIP images confirms the above findings
IMPRESSION: 1. Normal CTA of the head and neck.
2. Left-sided aortic arch.
3. Mild cerebral white matter disease, nonspecific, but stable from
prior. No acute intracranial process identified.

## 2019-01-31 ENCOUNTER — Emergency Department (HOSPITAL_COMMUNITY)
Admission: EM | Admit: 2019-01-31 | Discharge: 2019-01-31 | Disposition: A | Discharge status: Home / Self Care | Payer: PRIVATE HEALTH INSURANCE | Attending: Emergency Medicine | Admitting: Emergency Medicine

## 2019-01-31 ENCOUNTER — Other Ambulatory Visit: Payer: Self-pay

## 2019-01-31 ENCOUNTER — Encounter (HOSPITAL_COMMUNITY): Payer: Self-pay | Admitting: Emergency Medicine

## 2019-01-31 DIAGNOSIS — M7989 Other specified soft tissue disorders: Secondary | ICD-10-CM | POA: Diagnosis not present

## 2019-01-31 DIAGNOSIS — J449 Chronic obstructive pulmonary disease, unspecified: Secondary | ICD-10-CM | POA: Diagnosis not present

## 2019-01-31 DIAGNOSIS — J45909 Unspecified asthma, uncomplicated: Secondary | ICD-10-CM | POA: Insufficient documentation

## 2019-01-31 DIAGNOSIS — F17210 Nicotine dependence, cigarettes, uncomplicated: Secondary | ICD-10-CM | POA: Diagnosis not present

## 2019-01-31 LAB — CBC WITH DIFFERENTIAL/PLATELET
Abs Immature Granulocytes: 0.02 10*3/uL (ref 0.00–0.07)
BASOS ABS: 0.1 10*3/uL (ref 0.0–0.1)
Basophils Relative: 1 %
EOS PCT: 2 %
Eosinophils Absolute: 0.2 10*3/uL (ref 0.0–0.5)
HCT: 49.9 % — ABNORMAL HIGH (ref 36.0–46.0)
HEMOGLOBIN: 15.4 g/dL — AB (ref 12.0–15.0)
Immature Granulocytes: 0 %
Lymphocytes Relative: 61 %
Lymphs Abs: 5 10*3/uL — ABNORMAL HIGH (ref 0.7–4.0)
MCH: 28.9 pg (ref 26.0–34.0)
MCHC: 30.9 g/dL (ref 30.0–36.0)
MCV: 93.8 fL (ref 80.0–100.0)
Monocytes Absolute: 0.4 10*3/uL (ref 0.1–1.0)
Monocytes Relative: 5 %
NEUTROS ABS: 2.6 10*3/uL (ref 1.7–7.7)
Neutrophils Relative %: 31 %
Platelets: 155 10*3/uL (ref 150–400)
RBC: 5.32 MIL/uL — ABNORMAL HIGH (ref 3.87–5.11)
RDW: 14.2 % (ref 11.5–15.5)
WBC: 8.3 10*3/uL (ref 4.0–10.5)
nRBC: 0 % (ref 0.0–0.2)

## 2019-01-31 LAB — BASIC METABOLIC PANEL
ANION GAP: 9 (ref 5–15)
BUN: 11 mg/dL (ref 6–20)
CHLORIDE: 106 mmol/L (ref 98–111)
CO2: 25 mmol/L (ref 22–32)
Calcium: 8.7 mg/dL — ABNORMAL LOW (ref 8.9–10.3)
Creatinine, Ser: 0.84 mg/dL (ref 0.44–1.00)
GFR calc Af Amer: >60 mL/min (ref >60)
GFR calc non Af Amer: >60 mL/min (ref >60)
Glucose, Bld: 114 mg/dL — ABNORMAL HIGH (ref 70–99)
POTASSIUM: 4.1 mmol/L (ref 3.5–5.1)
SODIUM: 140 mmol/L (ref 135–145)

## 2019-01-31 MED ORDER — ENOXAPARIN SODIUM 100 MG/ML ~~LOC~~ SOLN
90.0000 mg | Freq: Two times a day (BID) | SUBCUTANEOUS | Status: DC
  Administered 2019-01-31: 90 mg via SUBCUTANEOUS
  Filled 2019-01-31: qty 1

## 2019-01-31 NOTE — Discharge Instructions (Addendum)
Schedule your ED ultrasound as noted below. Return with any worsening shortness of breath or chest pain.   IMPORTANT PATIENT INSTRUCTIONS:  Your ED provider has recommended an Outpatient Ultrasound.  Please call [phone] to schedule an appointment.  If your appointment is scheduled for a Saturday, Sunday or holiday, please go to the [facility] Registration Desk at least 15 minutes prior to your appointment time and tell them you are there for an ultrasound.    If your appointment is scheduled for a weekday (Monday-Friday), please go directly to the [facility] at least 15 minutes prior to your appointment time and tell them you are there for an ultrasound.  Please call [phone] with questions.

## 2019-01-31 NOTE — Progress Notes (Signed)
ANTICOAGULATION CONSULT NOTE - Initial Consult  Pharmacy Consult for: Lovenox dosing Indication: treatment of VTE  Allergies  Allergen Reactions  . Bee Venom Anaphylaxis  . Penicillins Anaphylaxis    Did it involve swelling of the face/tongue/throat, SOB, or low BP? Yes Did it involve sudden or severe rash/hives, skin peeling, or any reaction on the inside of your mouth or nose? Unknown Did you need to seek medical attention at a hospital or doctor's office? Yes  When did it last happen?infant-after birth If all above answers are "NO", may proceed with cephalosporin use.   . Shellfish Allergy Anaphylaxis  . Eggs Or Egg-Derived Products Swelling  . Meloxicam Swelling  . Tree Extract Swelling    Tree nut    Patient Measurements: Height: 5\' 3"  (160 cm) Weight: 206 lb (93.4 kg) IBW/kg (Calculated) : 52.4 Heparin Dosing Weight: HEPARIN DW (KG): 73.9  Vital Signs: Temp: 98.4 F (36.9 C) (02/02 1241) Temp Source: Oral (02/02 1241) BP: 121/86 (02/02 1241) Pulse Rate: 80 (02/02 1241)  Labs: Recent Labs    01/31/19 1624  HGB 15.4*  HCT 49.9*  PLT 155  CREATININE 0.84    Estimated Creatinine Clearance: 89 mL/min (by C-G formula based on SCr of 0.84 mg/dL).   Medical History: Past Medical History:  Diagnosis Date  . Asthma   . Migraines   . Situs inversus   . Vertigo     Assessment: Pharmacy consulted to dose Lovenox for this 48 yo female with VTE.  She has no prior history of DVT.  Her renal function and CBC appear normal at this time, though platelet count is 155K.  Goal of Therapy:  Monitor platelets by anticoagulation protocol: Yes   Plan:  Start Lovenox 90mg  sub-q q12h Monitor patient for signs and symptoms of bleeding. CBC and BMet qM-W-F  Tama High, Pharm. D. Clinical Pharmacist 01/31/2019 5:49 PM

## 2019-01-31 NOTE — ED Triage Notes (Signed)
Pt c/o of right leg swelling since Wednesday. Hot to the touch with mild tenderness.

## 2019-01-31 NOTE — ED Provider Notes (Signed)
Emergency Department Provider Note   I have reviewed the triage vital signs and the nursing notes.   HISTORY  Chief Complaint Leg Swelling   HPI April Mclaughlin is a 48 y.o. female with PMH of asthma, migraine HA, and vertigo presents to the emergency department for evaluation of right leg swelling, pain, redness.  She denies any fevers.  The patient is a driver for a living.  She has no prior history of DVT.  She noticed 4 days ago that her right leg was swollen and somewhat painful.  She states that the swelling has decreased to some extent but was advised to present here for evaluation of DVT.  She denies any chest pain or shortness of breath above her baseline.  She states that she does have asthma and has continued to use her albuterol inhaler, possibly slightly more than normal, but no significant change.  Denies any shaking chills.  States that the redness and swelling have improved over the past 1 to 2 days but she is concerned due to an upcoming trip.  Past Medical History:  Diagnosis Date  . Asthma   . Migraines   . Situs inversus   . Vertigo     There are no active problems to display for this patient.   History reviewed. No pertinent surgical history.  Allergies Bee venom; Penicillins; Shellfish allergy; Eggs or egg-derived products; Meloxicam; and Tree extract  Family History  Problem Relation Age of Onset  . Non-Hodgkin's lymphoma Father   . COPD Father   . Emphysema Mother   . COPD Mother     Social History Social History   Tobacco Use  . Smoking status: Current Every Day Smoker    Packs/day: 2.00    Years: 20.00    Pack years: 40.00    Types: Cigarettes  . Smokeless tobacco: Never Used  Substance Use Topics  . Alcohol use: No  . Drug use: No    Review of Systems  Constitutional: No fever/chills Eyes: No visual changes. ENT: No sore throat. Cardiovascular: Denies chest pain. Respiratory: Denies shortness of breath. Gastrointestinal:  No abdominal pain.  No nausea, no vomiting.  No diarrhea.  No constipation. Genitourinary: Negative for dysuria. Musculoskeletal: Positive right leg pain and swelling.  Skin: Negative for rash. Neurological: Negative for headaches, focal weakness or numbness.  10-point ROS otherwise negative.  ____________________________________________   PHYSICAL EXAM:  VITAL SIGNS: ED Triage Vitals  Enc Vitals Group     BP 01/31/19 1241 121/86     Pulse Rate 01/31/19 1241 80     Resp 01/31/19 1241 16     Temp 01/31/19 1241 98.4 F (36.9 C)     Temp Source 01/31/19 1241 Oral     SpO2 01/31/19 1241 98 %     Weight 01/31/19 1240 206 lb (93.4 kg)     Height 01/31/19 1240 5\' 3"  (1.6 m)     Pain Score 01/31/19 1240 3   Constitutional: Alert and oriented. Well appearing and in no acute distress. Eyes: Conjunctivae are normal.  Head: Atraumatic. Nose: No congestion/rhinnorhea. Mouth/Throat: Mucous membranes are moist. Neck: No stridor.   Cardiovascular: Normal rate, regular rhythm. Good peripheral circulation. Grossly normal heart sounds.   Respiratory: Normal respiratory effort.  No retractions. Lungs CTAB. Gastrointestinal: No distention.  Musculoskeletal: Unilateral swelling of the RLE with mild erythema. No increased warmth or concern for abscess.  Neurologic:  Normal speech and language.  Skin:  Skin is warm, dry and intact. No  rash noted.  ____________________________________________   LABS (all labs ordered are listed, but only abnormal results are displayed)  Labs Reviewed  CBC WITH DIFFERENTIAL/PLATELET - Abnormal; Notable for the following components:      Result Value   RBC 5.32 (*)    Hemoglobin 15.4 (*)    HCT 49.9 (*)    Lymphs Abs 5.0 (*)    All other components within normal limits  BASIC METABOLIC PANEL - Abnormal; Notable for the following components:   Glucose, Bld 114 (*)    Calcium 8.7 (*)    All other components within normal limits    ____________________________________________   PROCEDURES  Procedure(s) performed:   Procedures  None ____________________________________________   INITIAL IMPRESSION / ASSESSMENT AND PLAN / ED COURSE  Pertinent labs & imaging results that were available during my care of the patient were reviewed by me and considered in my medical decision making (see chart for details).  Patient presents to the emergency department for evaluation of right lower extremity pain and swelling with mild redness.  The exam is not consistent with cellulitis.  She is had slightly elevated risk for DVT given her occupation.  I do feel she would benefit from DVT ultrasound of the right lower extremity.  My concern for pulmonary embolism is very low and I do not feel CT scan of the chest is warranted at this time.  D-dimer was ordered from triage but canceled by myself given need for ultrasound and no utility in making that decision for me.  I do not have access to DVT ultrasound at this time.  We will have to schedule an appointment for the patient to come back in the morning for the study.  Plan to cover with Lovenox prior to leaving the emergency department given my somewhat elevated suspicion.   Labs reviewed.  No acute findings.  Patient to return tomorrow for lower extremity ultrasound.  I have ordered Lovenox.  Patient does not have an anticoagulation contraindication.  Discussed returning to the emergency department sooner if she develops chest pain or shortness of breath.  ____________________________________________  FINAL CLINICAL IMPRESSION(S) / ED DIAGNOSES  Final diagnoses:  Leg swelling    Note:  This document was prepared using Dragon voice recognition software and may include unintentional dictation errors.  Alona Bene, MD Emergency Medicine    Long, Arlyss Repress, MD 01/31/19 [phone]

## 2019-02-01 ENCOUNTER — Ambulatory Visit (HOSPITAL_COMMUNITY)
Admission: RE | Admit: 2019-02-01 | Discharge: 2019-02-01 | Disposition: A | Discharge status: Home / Self Care | Payer: BLUE CROSS/BLUE SHIELD | Source: Ambulatory Visit | Attending: Emergency Medicine | Admitting: Emergency Medicine

## 2019-02-01 DIAGNOSIS — M7989 Other specified soft tissue disorders: Secondary | ICD-10-CM | POA: Diagnosis not present

## 2019-02-01 DIAGNOSIS — I82441 Acute embolism and thrombosis of right tibial vein: Secondary | ICD-10-CM | POA: Diagnosis not present

## 2019-02-01 DIAGNOSIS — I82431 Acute embolism and thrombosis of right popliteal vein: Secondary | ICD-10-CM | POA: Diagnosis not present

## 2019-02-01 DIAGNOSIS — M79604 Pain in right leg: Secondary | ICD-10-CM | POA: Diagnosis present

## 2019-02-01 NOTE — ED Provider Notes (Signed)
Pt returning today for LE [place]. It is positive for DVT. Pt provided with a copy of the report and a prescription for xarelto starter pack. Discussed the need for close PCP follow-up as well as emergent return precautions. She has no new complaints since last evaluated. 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 This may have precipitated it.    Raeford Razor, MD 02/01/19 1331

## 2020-04-24 ENCOUNTER — Encounter (HOSPITAL_COMMUNITY): Payer: Self-pay | Admitting: Emergency Medicine

## 2020-04-24 ENCOUNTER — Emergency Department (HOSPITAL_COMMUNITY)
Admission: EM | Admit: 2020-04-24 | Discharge: 2020-04-24 | Disposition: A | Discharge status: Home / Self Care | Payer: BC Managed Care – PPO | Attending: Emergency Medicine | Admitting: Emergency Medicine

## 2020-04-24 ENCOUNTER — Other Ambulatory Visit: Payer: Self-pay

## 2020-04-24 DIAGNOSIS — M79662 Pain in left lower leg: Secondary | ICD-10-CM | POA: Insufficient documentation

## 2020-04-24 DIAGNOSIS — G43009 Migraine without aura, not intractable, without status migrainosus: Secondary | ICD-10-CM | POA: Diagnosis not present

## 2020-04-24 DIAGNOSIS — R519 Headache, unspecified: Secondary | ICD-10-CM | POA: Diagnosis present

## 2020-04-24 MED ORDER — PREDNISONE 20 MG PO TABS
40.0000 mg | ORAL_TABLET | Freq: Once | ORAL | Status: AC
  Administered 2020-04-24: 40 mg via ORAL
  Filled 2020-04-24: qty 2

## 2020-04-24 MED ORDER — DIPHENHYDRAMINE HCL 50 MG/ML IJ SOLN
25.0000 mg | Freq: Once | INTRAMUSCULAR | Status: AC
  Administered 2020-04-24: 25 mg via INTRAMUSCULAR
  Filled 2020-04-24: qty 1

## 2020-04-24 MED ORDER — METOCLOPRAMIDE HCL 5 MG/ML IJ SOLN
10.0000 mg | Freq: Once | INTRAMUSCULAR | Status: AC
  Administered 2020-04-24: 10 mg via INTRAMUSCULAR
  Filled 2020-04-24: qty 2

## 2020-04-24 MED ORDER — ENOXAPARIN SODIUM 100 MG/ML ~~LOC~~ SOLN
1.0000 mg/kg | Freq: Once | SUBCUTANEOUS | Status: AC
  Administered 2020-04-24: 100 mg via SUBCUTANEOUS
  Filled 2020-04-24: qty 1

## 2020-04-24 NOTE — Discharge Instructions (Addendum)
You have been scheduled to return here tomorrow for an ultrasound of your left leg.  Call the central scheduling department at [phone] tomorrow morning to schedule the appointment time.  Elevate your left leg when possible.  Follow-up with your primary doctor for recheck.

## 2020-04-24 NOTE — ED Triage Notes (Signed)
Pt c/o headache and left leg pain since Friday. Pt denies any injury at this time.

## 2020-04-25 ENCOUNTER — Ambulatory Visit (HOSPITAL_COMMUNITY)
Admission: RE | Admit: 2020-04-25 | Discharge: 2020-04-25 | Disposition: A | Discharge status: Home / Self Care | Payer: BC Managed Care – PPO | Source: Ambulatory Visit | Attending: Emergency Medicine | Admitting: Emergency Medicine

## 2020-04-25 DIAGNOSIS — M7989 Other specified soft tissue disorders: Secondary | ICD-10-CM | POA: Insufficient documentation

## 2020-04-25 DIAGNOSIS — M79662 Pain in left lower leg: Secondary | ICD-10-CM | POA: Diagnosis not present

## 2020-04-25 DIAGNOSIS — Z86718 Personal history of other venous thrombosis and embolism: Secondary | ICD-10-CM | POA: Insufficient documentation

## 2020-04-25 NOTE — ED Provider Notes (Signed)
   04/25/20  1615  Pt returned today for her scheduled out patient [place] of her left lower extremity.  She was seen last evening by me.  Another provider attempted to notify pt earlier today of her [place] results, but pt was not in the waiting area.  I have advised pt that her [place] was negative for presence of DVT.   Pt reports feeling better today and left leg pain is improving.  Advised to f/u with her PCP.      US Venous Img Lower Unilateral Left (DVT)  Result Date: 04/25/2020 CLINICAL DATA:  49 year old female with lower leg pain and swelling EXAM: LEFT LOWER EXTREMITY VENOUS DOPPLER ULTRASOUND TECHNIQUE: Gray-scale sonography with graded compression, as well as color Doppler and duplex ultrasound were performed to evaluate the lower extremity deep venous systems from the level of the common femoral vein and including the common femoral, femoral, profunda femoral, popliteal and calf veins including the posterior tibial, peroneal and gastrocnemius veins when visible. The superficial great saphenous vein was also interrogated. Spectral Doppler was utilized to evaluate flow at rest and with distal augmentation maneuvers in the common femoral, femoral and popliteal veins. COMPARISON:  None. FINDINGS: Contralateral Common Femoral Vein: Respiratory phasicity is normal and symmetric with the symptomatic side. No evidence of thrombus. Normal compressibility. Common Femoral Vein: No evidence of thrombus. Normal compressibility, respiratory phasicity and response to augmentation. Saphenofemoral Junction: No evidence of thrombus. Normal compressibility and flow on color Doppler imaging. Profunda Femoral Vein: No evidence of thrombus. Normal compressibility and flow on color Doppler imaging. Femoral Vein: No evidence of thrombus. Normal compressibility, respiratory phasicity and response to augmentation. Popliteal Vein: No evidence of thrombus. Normal compressibility, respiratory phasicity and response to augmentation. Calf  Veins: No evidence of thrombus. Normal compressibility and flow on color Doppler imaging. Superficial Great Saphenous Vein: No evidence of thrombus. Normal compressibility and flow on color Doppler imaging. Other Findings:  None. IMPRESSION: Sonographic survey of the left lower extremity negative for DVT Electronically Signed   By: Gilmer Mor D.O.   On: 04/25/2020 14:06      Pauline Aus, PA-C 04/25/20 1639    Vanetta Mulders, MD 04/29/20 [phone]

## 2020-04-25 NOTE — ED Provider Notes (Signed)
Patient returns today for ultrasound of her left lower extremity to rule out DVT.  This study was reviewed by me and is negative.  I attempted to find patient in the department including the waiting room to give her her test results.  She did not respond, I suspect she has left the department.  Patient does have an active MyChart account. Advise prn follow-up with her PCP given today's negative findings.   Burgess Amor, PA-C 04/25/20 1508    Blane Ohara, MD 04/25/20 1525
